# Patient Record
Sex: Female | Born: 1955 | Race: Black or African American | Hispanic: No | Marital: Single | Smoking: Never smoker
Health system: Southern US, Community
[De-identification: ages and names within clinical notes are randomized; demographics above are authoritative.]

## PROBLEM LIST (undated history)

## (undated) DIAGNOSIS — E119 Type 2 diabetes mellitus without complications: Secondary | ICD-10-CM

## (undated) DIAGNOSIS — I10 Essential (primary) hypertension: Secondary | ICD-10-CM

## (undated) HISTORY — PX: REPLACEMENT TOTAL KNEE: SUR1224

## (undated) HISTORY — PX: JOINT REPLACEMENT: SHX530

## (undated) HISTORY — PX: SHOULDER SURGERY: SHX246

---

## 2019-08-08 ENCOUNTER — Ambulatory Visit (HOSPITAL_COMMUNITY)
Admission: EM | Admit: 2019-08-08 | Discharge: 2019-08-08 | Disposition: A | Discharge status: Home / Self Care | Payer: Medicare PPO | Attending: Family Medicine | Admitting: Family Medicine

## 2019-08-08 ENCOUNTER — Other Ambulatory Visit: Payer: Self-pay

## 2019-08-08 ENCOUNTER — Encounter (HOSPITAL_COMMUNITY): Payer: Self-pay | Admitting: *Deleted

## 2019-08-08 ENCOUNTER — Ambulatory Visit (INDEPENDENT_AMBULATORY_CARE_PROVIDER_SITE_OTHER): Payer: Medicare PPO

## 2019-08-08 DIAGNOSIS — R05 Cough: Secondary | ICD-10-CM | POA: Diagnosis not present

## 2019-08-08 DIAGNOSIS — R6889 Other general symptoms and signs: Secondary | ICD-10-CM

## 2019-08-08 DIAGNOSIS — E119 Type 2 diabetes mellitus without complications: Secondary | ICD-10-CM | POA: Diagnosis not present

## 2019-08-08 DIAGNOSIS — Z794 Long term (current) use of insulin: Secondary | ICD-10-CM | POA: Diagnosis not present

## 2019-08-08 DIAGNOSIS — U071 COVID-19: Secondary | ICD-10-CM | POA: Diagnosis not present

## 2019-08-08 DIAGNOSIS — R519 Headache, unspecified: Secondary | ICD-10-CM | POA: Diagnosis not present

## 2019-08-08 DIAGNOSIS — R0602 Shortness of breath: Secondary | ICD-10-CM | POA: Diagnosis not present

## 2019-08-08 DIAGNOSIS — B349 Viral infection, unspecified: Secondary | ICD-10-CM

## 2019-08-08 DIAGNOSIS — I10 Essential (primary) hypertension: Secondary | ICD-10-CM | POA: Insufficient documentation

## 2019-08-08 DIAGNOSIS — Z79899 Other long term (current) drug therapy: Secondary | ICD-10-CM | POA: Diagnosis not present

## 2019-08-08 DIAGNOSIS — Z8249 Family history of ischemic heart disease and other diseases of the circulatory system: Secondary | ICD-10-CM | POA: Insufficient documentation

## 2019-08-08 DIAGNOSIS — R059 Cough, unspecified: Secondary | ICD-10-CM

## 2019-08-08 HISTORY — DX: Essential (primary) hypertension: I10

## 2019-08-08 HISTORY — DX: Type 2 diabetes mellitus without complications: E11.9

## 2019-08-08 MED ORDER — ALBUTEROL SULFATE HFA 108 (90 BASE) MCG/ACT IN AERS: 2.0000 | INHALATION_SPRAY | RESPIRATORY_TRACT | 2 refills | Status: AC | PRN

## 2019-08-08 MED ORDER — DEXAMETHASONE SODIUM PHOSPHATE 10 MG/ML IJ SOLN
10.0000 mg | Freq: Once | INTRAMUSCULAR | Status: AC
  Administered 2019-08-08: 10 mg via INTRAMUSCULAR

## 2019-08-08 MED ORDER — DEXAMETHASONE SODIUM PHOSPHATE 10 MG/ML IJ SOLN
INTRAMUSCULAR | Status: AC
  Filled 2019-08-08: qty 1

## 2019-08-08 NOTE — Discharge Instructions (Addendum)
Your COVID test is pending.  You should self quarantine until your test result is back and is negative.    Take Tylenol as needed for fever or discomfort.  Rest and keep yourself hydrated.    Your x-ray today shows some scarring, and lower lung volume.  I think that some deep breathing exercises would be a good option to help increase her lung capacity.  Go to the emergency department if you develop high fever, shortness of breath, severe diarrhea, or other concerning symptoms.

## 2019-08-08 NOTE — ED Triage Notes (Signed)
Pt c/o HA, body aches, slight nasal congestion, and slight SOB x 1.5 wks without fever.  Denies any pain at present.

## 2019-08-08 NOTE — ED Provider Notes (Signed)
[facility]    CSN: [id number] Arrival date & time: 08/08/19  1732      History   Chief Complaint Chief Complaint  Patient presents with  . Headache  . Generalized Body Aches    HPI Angelica Snyder is a 64 y.o. female.   Patient reports headache, body aches, nasal congestion and shortness of breath times a week and a half.  Denies fever, denies any sick contacts.  Reports that she has not been tested for Covid in the last 2 weeks.  Denies fever, sore throat, chills, abdominal pain, nausea, vomiting, rash, any other symptoms.  ROS Per HPI  The history is provided by the patient.    Past Medical History:  Diagnosis Date  . Diabetes mellitus without complication ([place])   . Hypertension     There are no problems to display for this patient.   Past Surgical History:  Procedure Laterality Date  . JOINT REPLACEMENT    . REPLACEMENT TOTAL KNEE    . SHOULDER SURGERY      OB History   No obstetric history on file.      Home Medications    Prior to Admission medications   Medication Sig Start Date End Date Taking? Authorizing Provider  insulin aspart (NOVOLOG) 100 UNIT/ML injection Inject 55 Units into the skin 3 (three) times daily before meals.   Yes [provider]  insulin glargine (LANTUS) 100 UNIT/ML injection Inject 22 Units into the skin daily.   Yes [provider]  METFORMIN HCL PO Take by mouth.   Yes [provider]  UNKNOWN TO PATIENT States she takes some other meds, but does not know names.  States one is a HTN med.   Yes [provider]  albuterol (VENTOLIN HFA) 108 (90 Base) MCG/ACT inhaler Inhale 2 puffs into the lungs every 4 (four) hours as needed for wheezing or shortness of breath. 08/08/19   Faustino Congress, NP    Family History Family History  Problem Relation Age of Onset  . Hypertension Mother   . Hypertension Father     Social History Social History   Tobacco Use  . Smoking status: Never  Smoker  . Smokeless tobacco: Never Used  Substance Use Topics  . Alcohol use: Not Currently  . Drug use: Never     Allergies   Patient has no known allergies.   Review of Systems Review of Systems   Physical Exam Triage Vital Signs ED Triage Vitals  Enc Vitals Group     BP 08/08/19 1753 112/72     Pulse Rate 08/08/19 1753 75     Resp 08/08/19 1753 16     Temp 08/08/19 1753 98.4 F (36.9 C)     Temp Source 08/08/19 1753 Oral     SpO2 08/08/19 1753 100 %     Weight --      Height --      Head Circumference --      Peak Flow --      Pain Score 08/08/19 1804 0     Pain Loc --      Pain Edu? --      Excl. in [place]? --    No data found.  Updated Vital Signs BP 112/72 (BP Location: Left Arm)   Pulse 75   Temp 98.4 F (36.9 C) (Oral)   Resp 16   SpO2 100%       Physical Exam Vitals and nursing note reviewed.  Constitutional:  General: She is not in acute distress.    Appearance: She is well-developed. She is obese. She is ill-appearing.  HENT:     Head: Normocephalic and atraumatic.     Right Ear: Tympanic membrane normal.     Left Ear: Tympanic membrane normal.     Nose: Congestion present.     Mouth/Throat:     Mouth: Mucous membranes are moist.  Eyes:     Conjunctiva/sclera: Conjunctivae normal.  Cardiovascular:     Rate and Rhythm: Normal rate and regular rhythm.     Heart sounds: Normal heart sounds. No murmur.  Pulmonary:     Effort: Pulmonary effort is normal. No respiratory distress.     Breath sounds: No stridor. No wheezing, rhonchi or rales.     Comments: Decreased breath sounds noted to mid and lower lobes of the left lung. Chest:     Chest wall: No tenderness.  Abdominal:     Palpations: Abdomen is soft.     Tenderness: There is no abdominal tenderness.  Musculoskeletal:        General: Normal range of motion.     Cervical back: Neck supple.  Lymphadenopathy:     Cervical: No cervical adenopathy.  Skin:    General: Skin is warm and  dry.     Capillary Refill: Capillary refill takes less than 2 seconds.  Neurological:     General: No focal deficit present.     Mental Status: She is alert and oriented to person, place, and time.  Psychiatric:        Mood and Affect: Mood normal.        Behavior: Behavior normal.      UC Treatments / Results  Labs (all labs ordered are listed, but only abnormal results are displayed) Labs Reviewed  NOVEL CORONAVIRUS, NAA (HOSP ORDER, SEND-OUT TO REF LAB; TAT 18-24 HRS)    EKG   Radiology DG Chest 2 View  Result Date: 08/08/2019 CLINICAL DATA:  Cough and shortness of breath for 1 week. Decreased breath sounds. EXAM: CHEST - 2 VIEW COMPARISON:  None. FINDINGS: The heart size and mediastinal contours are within normal limits. Aortic atherosclerosis incidentally noted. Linear opacity is seen in the lingula, which may be due to scarring or subsegmental atelectasis. No evidence of pulmonary airspace disease or edema. No evidence of pleural effusion or pneumothorax. IMPRESSION: Mild lingular scarring versus subsegmental atelectasis. No evidence of pulmonary consolidation or edema. Electronically Signed   By: Danae Orleans M.D.   On: 08/08/2019 18:43    Procedures Procedures (including critical care time)  Medications Ordered in UC Medications  dexamethasone (DECADRON) injection 10 mg (10 mg Intramuscular Given 08/08/19 1855)    Initial Impression / Assessment and Plan / UC Course  I have reviewed the triage vital signs and the nursing notes.  Pertinent labs & imaging results that were available during my care of the patient were reviewed by me and considered in my medical decision making (see chart for details).     Likely viral illness, headache, shortness of breath, body aches, dry cough.  Decreased lung sounds noted to the left lung.  Chest x-ray shows mild lingular scarring versus subsegmental atelectasis.  Instructed patient on the importance and use of incentive spirometry,  sent home with incentive spirometer.  Patient verbalizes understanding.  Decadron 10 mg IM given in office today.  Did not do course of prednisone due to type 2 diabetes not well controlled.  Prescribed albuterol inhaler to use 2  puffs every 4-6 hours as needed for shortness of breath, dry cough, wheezing.  Send out Covid test pending, will inform patient of results when they are back.  Instructed to quarantine until results are back and negative.  Instructed to go to the ER with severe shortness of breath, high fever, other concerning symptoms. Final Clinical Impressions(s) / UC Diagnoses   Final diagnoses:  Nonintractable headache, unspecified chronicity pattern, unspecified headache type  Viral illness  Cough     Discharge Instructions     Your COVID test is pending.  You should self quarantine until your test result is back and is negative.    Take Tylenol as needed for fever or discomfort.  Rest and keep yourself hydrated.    Your x-ray today shows some scarring, and lower lung volume.  I think that some deep breathing exercises would be a good option to help increase her lung capacity.  Go to the emergency department if you develop high fever, shortness of breath, severe diarrhea, or other concerning symptoms.       ED Prescriptions    Medication Sig Dispense Auth. Provider   albuterol (VENTOLIN HFA) 108 (90 Base) MCG/ACT inhaler Inhale 2 puffs into the lungs every 4 (four) hours as needed for wheezing or shortness of breath. 18 g Moshe Cipro, NP     PDMP not reviewed this encounter.   Moshe Cipro, NP 08/08/19 2041

## 2019-08-09 LAB — NOVEL CORONAVIRUS, NAA (HOSP ORDER, SEND-OUT TO REF LAB; TAT 18-24 HRS): SARS-CoV-2, NAA: DETECTED — AB

## 2019-08-10 ENCOUNTER — Telehealth (HOSPITAL_COMMUNITY): Payer: Self-pay | Admitting: Emergency Medicine

## 2019-08-10 NOTE — Telephone Encounter (Signed)

## 2019-08-12 ENCOUNTER — Telehealth (HOSPITAL_COMMUNITY): Payer: Self-pay | Admitting: Emergency Medicine

## 2019-08-12 NOTE — Telephone Encounter (Signed)
Patient contacted by phone and made aware of  positive covid  results. Pt verbalized understanding and had all questions answered.    

## 2021-10-24 IMAGING — DX DG CHEST 2V
2 series · 2 of 2 positions shown · non-contrast
Comparison: None.

CLINICAL DATA: Cough and shortness of breath for 1 week. Decreased
breath sounds.

EXAM:
CHEST - 2 VIEW

[chest pa]
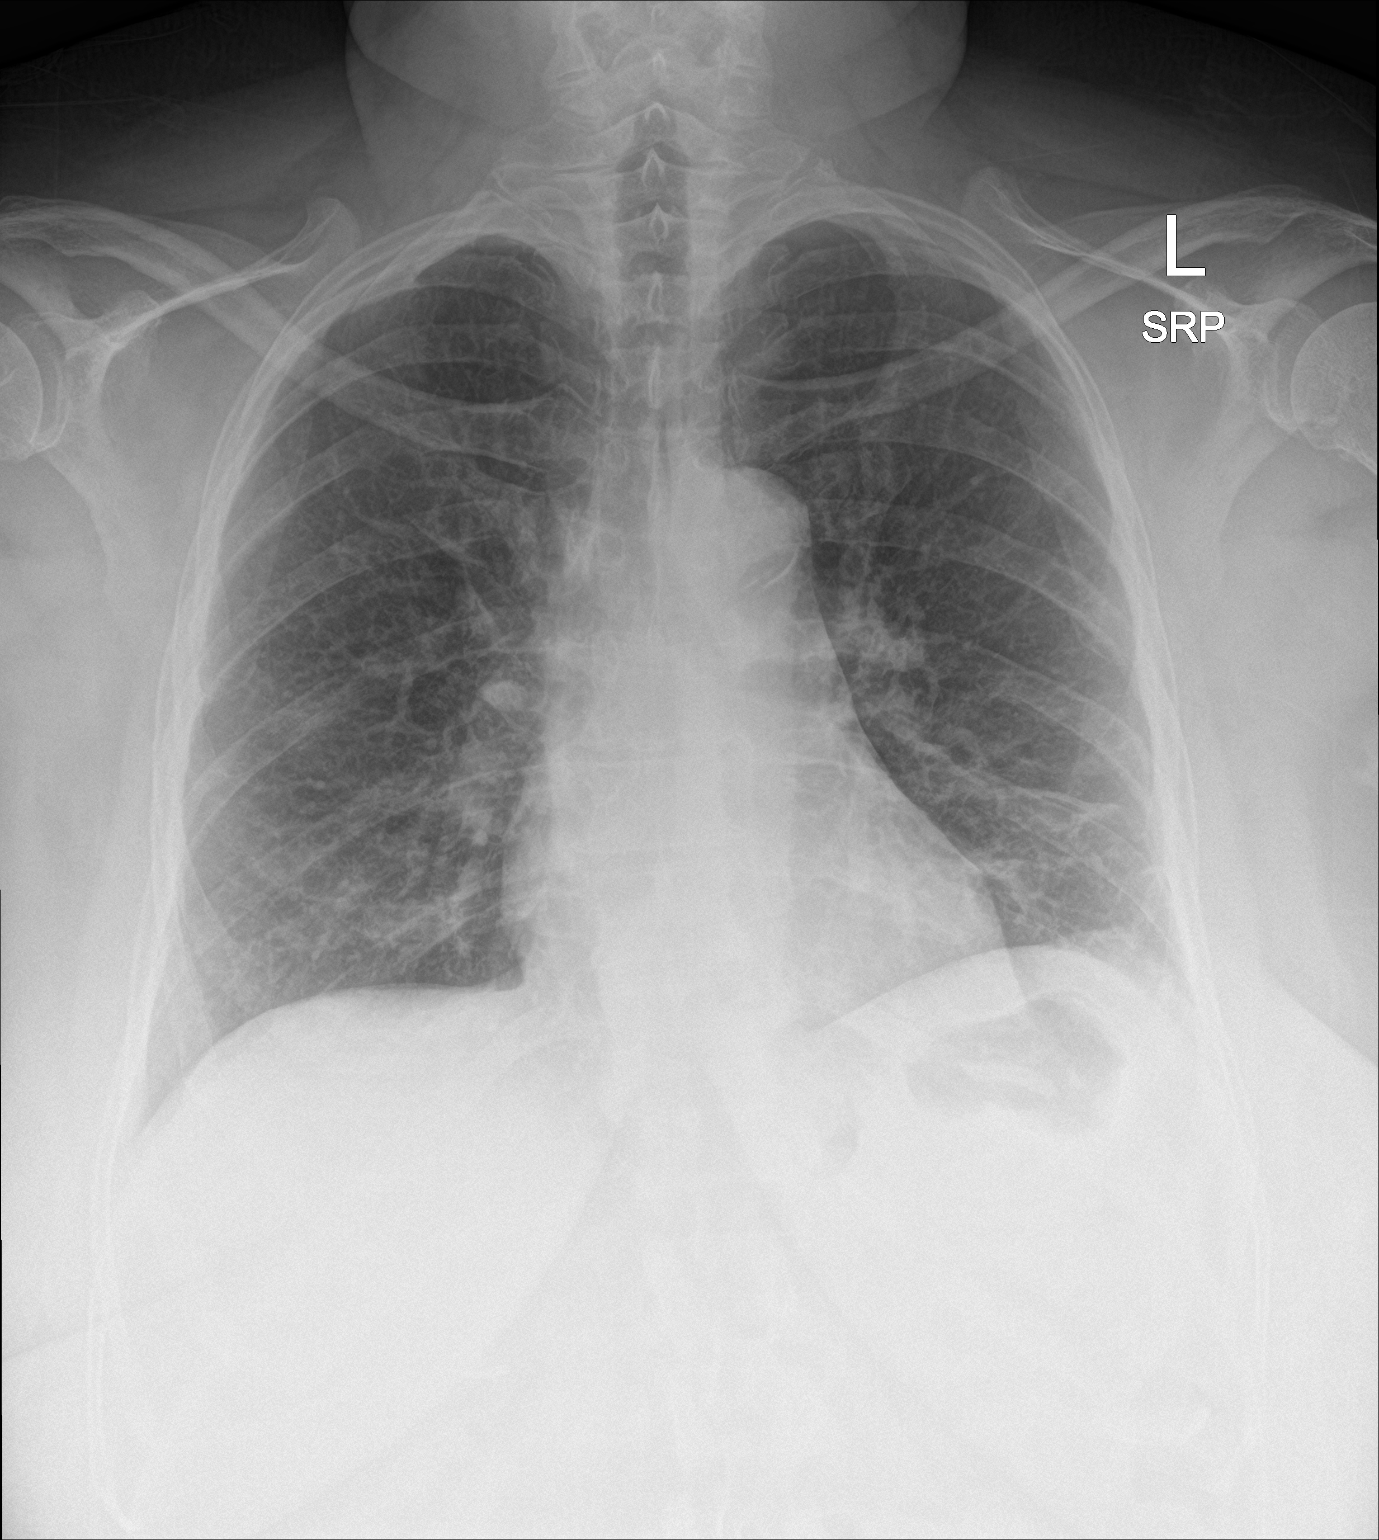

[chest lat]
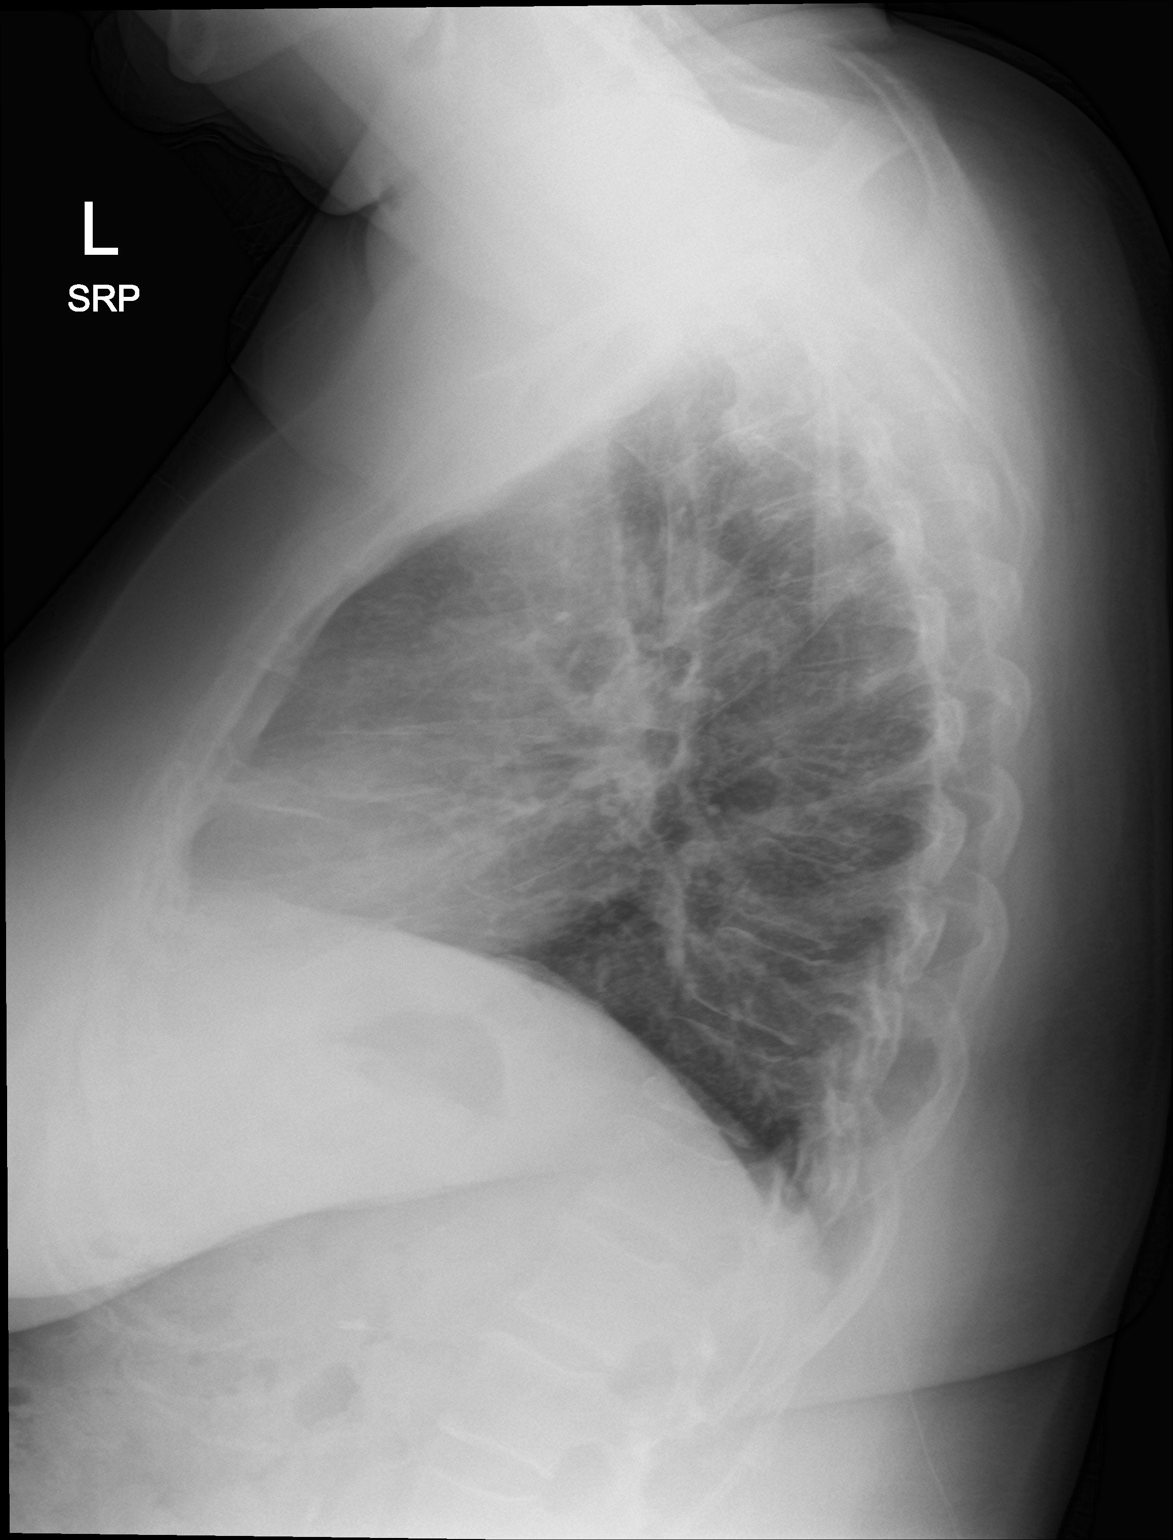

[2 of 2 positions shown; findings below may reference images not displayed]

FINDINGS: The heart size and mediastinal contours are within normal limits.
Aortic atherosclerosis incidentally noted.

Linear opacity is seen in the lingula, which may be due to scarring
or subsegmental atelectasis. No evidence of pulmonary airspace
disease or edema. No evidence of pleural effusion or pneumothorax.
IMPRESSION: Mild lingular scarring versus subsegmental atelectasis. No evidence
of pulmonary consolidation or edema.
